# Patient Record
Sex: Male | Born: 1955 | Race: White | Hispanic: No | Marital: Single
Health system: Southern US, Community
[De-identification: ages and names within clinical notes are randomized; demographics above are authoritative.]

---

## 2020-06-07 ENCOUNTER — Encounter (HOSPITAL_COMMUNITY): Payer: Self-pay | Admitting: Emergency Medicine

## 2020-06-07 ENCOUNTER — Emergency Department (HOSPITAL_COMMUNITY): Payer: Self-pay

## 2020-06-07 ENCOUNTER — Emergency Department (HOSPITAL_COMMUNITY)
Admission: EM | Admit: 2020-06-07 | Discharge: 2020-06-07 | Disposition: A | Discharge status: Home / Self Care | Payer: Self-pay | Attending: Emergency Medicine | Admitting: Emergency Medicine

## 2020-06-07 DIAGNOSIS — Y906 Blood alcohol level of 120-199 mg/100 ml: Secondary | ICD-10-CM | POA: Insufficient documentation

## 2020-06-07 DIAGNOSIS — S0101XA Laceration without foreign body of scalp, initial encounter: Secondary | ICD-10-CM | POA: Insufficient documentation

## 2020-06-07 DIAGNOSIS — W01198A Fall on same level from slipping, tripping and stumbling with subsequent striking against other object, initial encounter: Secondary | ICD-10-CM | POA: Insufficient documentation

## 2020-06-07 DIAGNOSIS — R000 Tachycardia, unspecified: Secondary | ICD-10-CM | POA: Insufficient documentation

## 2020-06-07 DIAGNOSIS — S0990XA Unspecified injury of head, initial encounter: Secondary | ICD-10-CM

## 2020-06-07 DIAGNOSIS — W19XXXA Unspecified fall, initial encounter: Secondary | ICD-10-CM

## 2020-06-07 DIAGNOSIS — F10129 Alcohol abuse with intoxication, unspecified: Secondary | ICD-10-CM | POA: Insufficient documentation

## 2020-06-07 DIAGNOSIS — F10920 Alcohol use, unspecified with intoxication, uncomplicated: Secondary | ICD-10-CM

## 2020-06-07 DIAGNOSIS — Y92009 Unspecified place in unspecified non-institutional (private) residence as the place of occurrence of the external cause: Secondary | ICD-10-CM | POA: Insufficient documentation

## 2020-06-07 DIAGNOSIS — S51012A Laceration without foreign body of left elbow, initial encounter: Secondary | ICD-10-CM | POA: Insufficient documentation

## 2020-06-07 LAB — COMPREHENSIVE METABOLIC PANEL
ALT: 24 U/L (ref 0–44)
AST: 50 U/L — ABNORMAL HIGH (ref 15–41)
Albumin: 4 g/dL (ref 3.5–5.0)
Alkaline Phosphatase: 109 U/L (ref 38–126)
Anion gap: 22 — ABNORMAL HIGH (ref 5–15)
BUN: 5 mg/dL — ABNORMAL LOW (ref 8–23)
CO2: 16 mmol/L — ABNORMAL LOW (ref 22–32)
Calcium: 9 mg/dL (ref 8.9–10.3)
Chloride: 93 mmol/L — ABNORMAL LOW (ref 98–111)
Creatinine, Ser: 1.01 mg/dL (ref 0.61–1.24)
GFR, Estimated: >60 mL/min (ref >60)
Glucose, Bld: 101 mg/dL — ABNORMAL HIGH (ref 70–99)
Potassium: 4 mmol/L (ref 3.5–5.1)
Sodium: 131 mmol/L — ABNORMAL LOW (ref 135–145)
Total Bilirubin: 0.8 mg/dL (ref 0.3–1.2)
Total Protein: 8 g/dL (ref 6.5–8.1)

## 2020-06-07 LAB — CBC WITH DIFFERENTIAL/PLATELET
Abs Immature Granulocytes: 0.05 10*3/uL (ref 0.00–0.07)
Basophils Absolute: 0.1 10*3/uL (ref 0.0–0.1)
Basophils Relative: 1 %
Eosinophils Absolute: 0 10*3/uL (ref 0.0–0.5)
Eosinophils Relative: 0 %
HCT: 43.5 % (ref 39.0–52.0)
Hemoglobin: 14.7 g/dL (ref 13.0–17.0)
Immature Granulocytes: 1 %
Lymphocytes Relative: 9 %
Lymphs Abs: 0.8 10*3/uL (ref 0.7–4.0)
MCH: 29.8 pg (ref 26.0–34.0)
MCHC: 33.8 g/dL (ref 30.0–36.0)
MCV: 88.2 fL (ref 80.0–100.0)
Monocytes Absolute: 0.6 10*3/uL (ref 0.1–1.0)
Monocytes Relative: 7 %
Neutro Abs: 6.9 10*3/uL (ref 1.7–7.7)
Neutrophils Relative %: 82 %
Platelets: 221 10*3/uL (ref 150–400)
RBC: 4.93 MIL/uL (ref 4.22–5.81)
RDW: 13.2 % (ref 11.5–15.5)
WBC: 8.4 10*3/uL (ref 4.0–10.5)
nRBC: 0 % (ref 0.0–0.2)

## 2020-06-07 LAB — ETHANOL: Alcohol, Ethyl (B): 143 mg/dL — ABNORMAL HIGH (ref <10)

## 2020-06-07 MED ORDER — SODIUM CHLORIDE 0.9 % IV BOLUS
500.0000 mL | Freq: Once | INTRAVENOUS | Status: AC
  Administered 2020-06-07: 500 mL via INTRAVENOUS

## 2020-06-07 MED ORDER — LIDOCAINE HCL (PF) 1 % IJ SOLN
INTRAMUSCULAR | Status: AC
  Filled 2020-06-07: qty 5

## 2020-06-07 MED ORDER — LIDOCAINE-EPINEPHRINE (PF) 2 %-1:200000 IJ SOLN
20.0000 mL | Freq: Once | INTRAMUSCULAR | Status: DC
  Filled 2020-06-07: qty 20

## 2020-06-07 MED ORDER — LIDOCAINE-EPINEPHRINE 1 %-1:100000 IJ SOLN
20.0000 mL | Freq: Once | INTRAMUSCULAR | Status: AC
  Administered 2020-06-07: 20 mL
  Filled 2020-06-07: qty 1

## 2020-06-07 MED ORDER — SODIUM CHLORIDE 0.9 % IV BOLUS
1000.0000 mL | Freq: Once | INTRAVENOUS | Status: AC
  Administered 2020-06-07: 1000 mL via INTRAVENOUS

## 2020-06-07 NOTE — ED Triage Notes (Signed)
Pt coming from home via EMS with complaints of a fall. Per EMS pt has small laceration on back on head, bleeding controlled. Reports of pt drinking case of beer and taking xanax.

## 2020-06-07 NOTE — Discharge Instructions (Addendum)
You were seen in the emergency department for evaluation of injuries from a fall.  You had a scalp laceration that was repaired with sutures.  These will need to be removed in 7 to 10 days.  Tylenol for pain.  Soap and water to your wounds.

## 2020-06-07 NOTE — ED Notes (Signed)
Spoke to pt step daughter and provided update. She will be coming to give him a ride home. Pt verbalized understanding of plan. Pt has been taken to restroom via wheelchair several times. Pt able to use restroom by self. Can stand up using rails.  Family stated he has been drinking since yesterday and recently go out of rehab.

## 2020-06-07 NOTE — ED Provider Notes (Signed)
[facility] EMERGENCY DEPARTMENT Provider Note   CSN: [id number] Arrival date & time: 06/07/20  1144     History Chief Complaint  Patient presents with  . Fall    Clarence Glass is a 65 y.o. male.  Level 5 caveat secondary altered mental status.  EMS is providing history.  Reportedly just got out of rehab.  Drinking heavily both last evening and this morning.  Fall at home.  Struck his head.  Posterior hematoma and laceration.  Placed in cervical collar for altered mental status.  Patient denies any complaints.  The history is provided by the patient and the EMS personnel.  Fall This is a new problem. The current episode started 1 to 2 hours ago. The problem has not changed since onset.Pertinent negatives include no chest pain, no abdominal pain, no headaches and no shortness of breath. Nothing aggravates the symptoms. Nothing relieves the symptoms. He has tried nothing for the symptoms. The treatment provided no relief.       History reviewed. No pertinent past medical history.  There are no problems to display for this patient.   History reviewed. No pertinent surgical history.     No family history on file.     Home Medications Prior to Admission medications   Not on File    Allergies    Patient has no allergy information on record.  Review of Systems   Review of Systems  Unable to perform ROS: Mental status change  Respiratory: Negative for shortness of breath.   Cardiovascular: Negative for chest pain.  Gastrointestinal: Negative for abdominal pain.  Neurological: Negative for headaches.    Physical Exam Updated Vital Signs BP 112/76 (BP Location: Left Arm)   Pulse (!) 126   Temp 97.6 F (36.4 C) (Oral)   Resp 18   SpO2 95%   Physical Exam Vitals and nursing note reviewed.  Constitutional:      Appearance: He is well-developed and well-nourished.  HENT:     Head: Normocephalic.     Comments: Posterior hematoma with  laceration Eyes:     Conjunctiva/sclera: Conjunctivae normal.  Neck:     Comments: In cervical collar trach midline. Cardiovascular:     Rate and Rhythm: Regular rhythm. Tachycardia present.     Heart sounds: No murmur heard.   Pulmonary:     Effort: Pulmonary effort is normal. No respiratory distress.     Breath sounds: Normal breath sounds.  Abdominal:     Palpations: Abdomen is soft.     Tenderness: There is no abdominal tenderness.  Musculoskeletal:        General: Tenderness and signs of injury present. No deformity or edema.     Right lower leg: No edema.     Left lower leg: No edema.     Comments: He has some bruising on his elbows and a skin tear on the left elbow.  Full range of motion.  Skin:    General: Skin is warm and dry.  Neurological:     General: No focal deficit present.     Mental Status: He is alert.     Comments: Patient is awake and alert.  Slurred speech consistent with intoxication.  Moving all extremities without any focal deficits.  Psychiatric:        Mood and Affect: Mood and affect normal.     ED Results / Procedures / Treatments   Labs (all labs ordered are listed, but only abnormal results are displayed) Labs Reviewed  COMPREHENSIVE METABOLIC PANEL - Abnormal; Notable for the following components:      Result Value   Sodium 131 (*)    Chloride 93 (*)    CO2 16 (*)    Glucose, Bld 101 (*)    BUN 5 (*)    AST 50 (*)    Anion gap 22 (*)    All other components within normal limits  ETHANOL - Abnormal; Notable for the following components:   Alcohol, Ethyl (B) 143 (*)    All other components within normal limits  CBC WITH DIFFERENTIAL/PLATELET    EKG None  Radiology CT Head Wo Contrast  Result Date: 06/07/2020 CLINICAL DATA:  Head trauma, abnormal mental status. Neck trauma, intoxicated or up tended. Additional history provided: Fall, small laceration on back of head, bleeding controlled. EXAM: CT HEAD WITHOUT CONTRAST CT CERVICAL  SPINE WITHOUT CONTRAST TECHNIQUE: Multidetector CT imaging of the head and cervical spine was performed following the standard protocol without intravenous contrast. Multiplanar CT image reconstructions of the cervical spine were also generated. COMPARISON:  No pertinent prior exams available for comparison. FINDINGS: CT HEAD FINDINGS Brain: Mild cerebral atrophy. Mild ill-defined hypoattenuation within the cerebral white matter is nonspecific, but compatible with chronic small vessel ischemic disease. There is no acute intracranial hemorrhage. No demarcated cortical infarct. No extra-axial fluid collection. No evidence of intracranial mass. No midline shift. Vascular: No hyperdense vessel. Skull: Normal. Negative for fracture or focal lesion. Sinuses/Orbits: Visualized orbits show no acute finding. Mild bilateral ethmoid and maxillary sinus mucosal thickening at the imaged levels. Other: Right occipital scalp laceration CT CERVICAL SPINE FINDINGS Alignment: Straightening of the expected cervical lordosis. No significant spondylolisthesis. Skull base and vertebrae: The basion-dental and atlanto-dental intervals are maintained.No evidence of acute fracture to the cervical spine. Soft tissues and spinal canal: No prevertebral fluid or swelling. No visible canal hematoma. Disc levels: Cervical spondylosis with multilevel disc space narrowing, disc bulges, posterior disc osteophytes and uncovertebral hypertrophy. Multilevel ventral osteophytes. Of note, a posterior disc osteophyte complex contributes to suspected at least moderate spinal canal stenosis at C4-C5. Multilevel bony neural foraminal narrowing. Upper chest: No consolidation within the imaged lung apices. No visible pneumothorax. Paraseptal emphysema. IMPRESSION: CT head: 1. No evidence of acute intracranial abnormality. 2. Right occipital scalp laceration. 3. Mild cerebral atrophy and chronic small vessel ischemic disease. 4. Mild paranasal sinus mucosal  thickening. CT cervical spine: 1. No evidence of acute fracture to the cervical spine. 2. Cervical spondylosis as described. Notably, a posterior disc osteophyte complex contributes to suspected at least moderate spinal canal stenosis at C4-C5. 3. Emphysema (ICD10-J43.9). Electronically Signed   By: Jackey Loge DO   On: 06/07/2020 12:59   CT Cervical Spine Wo Contrast  Result Date: 06/07/2020 CLINICAL DATA:  Head trauma, abnormal mental status. Neck trauma, intoxicated or up tended. Additional history provided: Fall, small laceration on back of head, bleeding controlled. EXAM: CT HEAD WITHOUT CONTRAST CT CERVICAL SPINE WITHOUT CONTRAST TECHNIQUE: Multidetector CT imaging of the head and cervical spine was performed following the standard protocol without intravenous contrast. Multiplanar CT image reconstructions of the cervical spine were also generated. COMPARISON:  No pertinent prior exams available for comparison. FINDINGS: CT HEAD FINDINGS Brain: Mild cerebral atrophy. Mild ill-defined hypoattenuation within the cerebral white matter is nonspecific, but compatible with chronic small vessel ischemic disease. There is no acute intracranial hemorrhage. No demarcated cortical infarct. No extra-axial fluid collection. No evidence of intracranial mass. No midline shift. Vascular: No hyperdense vessel. Skull:  Normal. Negative for fracture or focal lesion. Sinuses/Orbits: Visualized orbits show no acute finding. Mild bilateral ethmoid and maxillary sinus mucosal thickening at the imaged levels. Other: Right occipital scalp laceration CT CERVICAL SPINE FINDINGS Alignment: Straightening of the expected cervical lordosis. No significant spondylolisthesis. Skull base and vertebrae: The basion-dental and atlanto-dental intervals are maintained.No evidence of acute fracture to the cervical spine. Soft tissues and spinal canal: No prevertebral fluid or swelling. No visible canal hematoma. Disc levels: Cervical spondylosis  with multilevel disc space narrowing, disc bulges, posterior disc osteophytes and uncovertebral hypertrophy. Multilevel ventral osteophytes. Of note, a posterior disc osteophyte complex contributes to suspected at least moderate spinal canal stenosis at C4-C5. Multilevel bony neural foraminal narrowing. Upper chest: No consolidation within the imaged lung apices. No visible pneumothorax. Paraseptal emphysema. IMPRESSION: CT head: 1. No evidence of acute intracranial abnormality. 2. Right occipital scalp laceration. 3. Mild cerebral atrophy and chronic small vessel ischemic disease. 4. Mild paranasal sinus mucosal thickening. CT cervical spine: 1. No evidence of acute fracture to the cervical spine. 2. Cervical spondylosis as described. Notably, a posterior disc osteophyte complex contributes to suspected at least moderate spinal canal stenosis at C4-C5. 3. Emphysema (ICD10-J43.9). Electronically Signed   By: Jackey LogeKyle  Golden DO   On: 06/07/2020 12:59    Procedures .Marland Kitchen.Laceration Repair  Date/Time: 06/07/2020 2:10 PM Performed by: Terrilee FilesButler, Rumi Taras C, MD Authorized by: Terrilee FilesButler, Dianey Suchy C, MD   Consent:    Consent obtained:  Verbal   Consent given by:  Patient   Risks discussed:  Infection, pain, poor cosmetic result, poor wound healing and retained foreign body   Alternatives discussed:  No treatment and delayed treatment Universal protocol:    Procedure explained and questions answered to patient or proxy's satisfaction: yes     Patient identity confirmed:  Verbally with patient Anesthesia:    Anesthesia method:  Local infiltration   Local anesthetic:  Lidocaine 2% WITH epi Laceration details:    Location:  Scalp   Scalp location:  Occipital   Length (cm):  4 Treatment:    Area cleansed with:  Saline   Amount of cleaning:  Standard Skin repair:    Repair method:  Staples   Number of staples:  5 Approximation:    Approximation:  Close Repair type:    Repair type:  Simple Post-procedure  details:    Dressing:  Open (no dressing)   Procedure completion:  Tolerated well, no immediate complications     Medications Ordered in ED Medications  sodium chloride 0.9 % bolus 1,000 mL (0 mLs Intravenous Stopped 06/07/20 1420)  lidocaine-EPINEPHrine (XYLOCAINE W/EPI) 1 %-1:100000 (with pres) injection 20 mL (20 mLs Infiltration Given 06/07/20 1419)  sodium chloride 0.9 % bolus 500 mL (0 mLs Intravenous Stopped 06/07/20 1621)    ED Course  I have reviewed the triage vital signs and the nursing notes.  Pertinent labs & imaging results that were available during my care of the patient were reviewed by me and considered in my medical decision making (see chart for details).  Clinical Course as of 06/07/20 1718  Mon Jun 07, 2020  1430 Patient has labs showing low sodium low bicarb.  Likely due to some beer potomania and alcoholic ketoacidosis.  He is tolerating fluids here. [MB]    Clinical Course User Index [MB] Terrilee FilesButler, Kendalynn Wideman C, MD   MDM Rules/Calculators/A&P  This patient complains of fall intoxication head lack; this involves an extensive number of treatment Options and is a complaint that carries with it a high risk of complications and Morbidity. The differential includes alcohol intoxication, skull fracture, head bleed, contusion, laceration, metabolic derangement  I ordered, reviewed and interpreted labs, which included CBC white count normal hemoglobin chemistries with mildly low sodium low bicarb reflecting some dehydration, question alcoholic ketoacidosis I ordered medication IV fluids oral fluids I ordered imaging studies which included CT head and cervical spine and I independently    visualized and interpreted imaging which showed atrophy and degenerative changes no acute bleed or fracture Additional history obtained from EMS Previous records obtained and reviewed in epic, no recent visits  After the interventions stated above, I reevaluated  the patient and found patient still to be unsteady on feet.  He is given some IV fluids here.  Wound has been repaired.  Nursing is going to try to reach out to family to see if he can find him a safe ride home.   Final Clinical Impression(s) / ED Diagnoses Final diagnoses:  Alcoholic intoxication without complication (HCC)  Fall, initial encounter  Minor head injury, initial encounter  Laceration of scalp, initial encounter    Rx / DC Orders ED Discharge Orders    None       Terrilee Files, MD 06/07/20 1721

## 2020-12-13 DEATH — deceased

## 2021-12-13 DEATH — deceased

## 2022-02-08 IMAGING — CT CT CERVICAL SPINE W/O CM
3 of 4 series · 13 of 33 positions shown, 16 images · non-contrast
Comparison: No pertinent prior exams available for comparison.

CLINICAL DATA: Head trauma, abnormal mental status. Neck trauma,
intoxicated or up tended. Additional history provided: Fall, small
laceration on back of head, bleeding controlled.

EXAM:
CT HEAD WITHOUT CONTRAST
CT CERVICAL SPINE WITHOUT CONTRAST
TECHNIQUE: Multidetector CT imaging of the head and cervical spine was
performed following the standard protocol without intravenous
contrast. Multiplanar CT image reconstructions of the cervical spine
were also generated.

[Series 8: coronal bone · coronal · 0.30mm/px · 3 of 87 slices shown]
[im 18/87  bone]
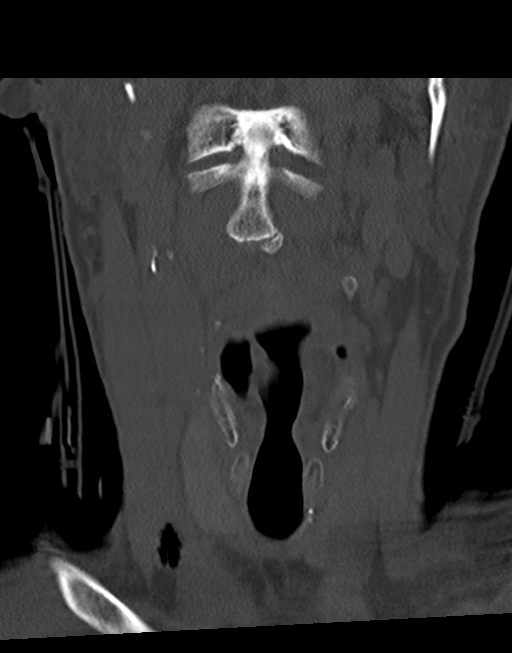
[im 35/87  bone]
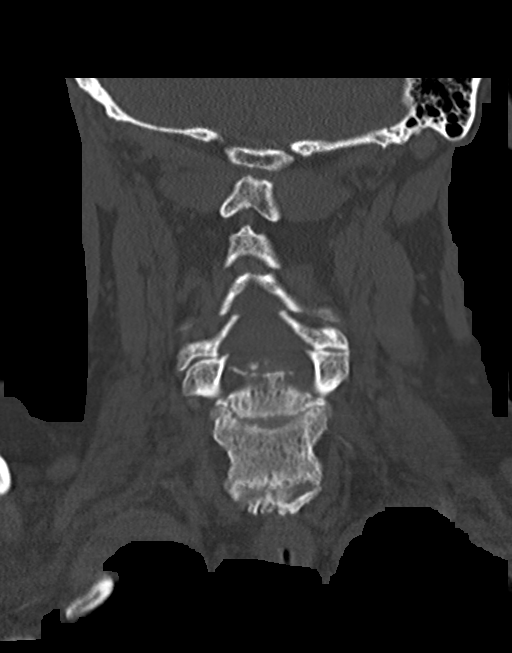
[im 52/87  bone]
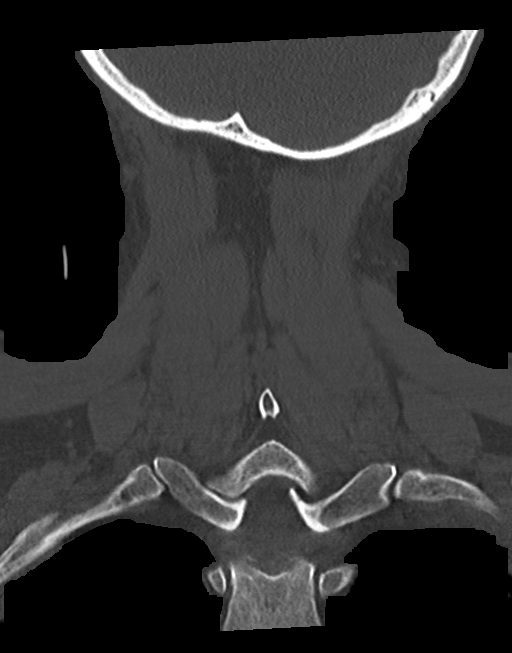

[Series 9: sagittal bone · sagittal · 0.37mm/px · 5 of 68 slices shown, 6 images]
[im 23/68  bone]
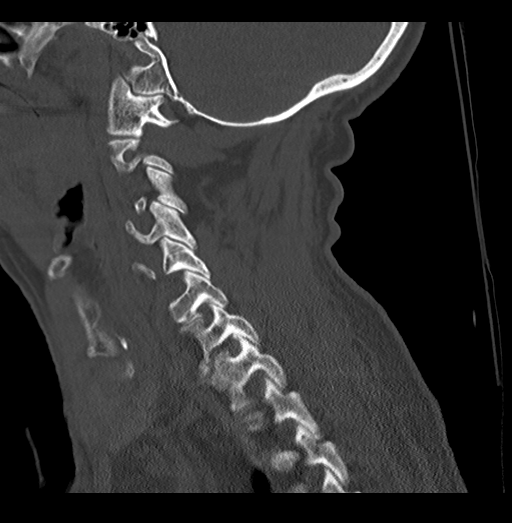
[im 28/68  bone]
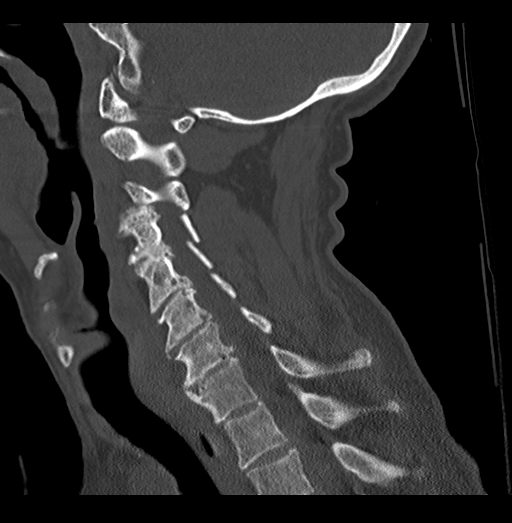
[im 34/68  soft-tissue]
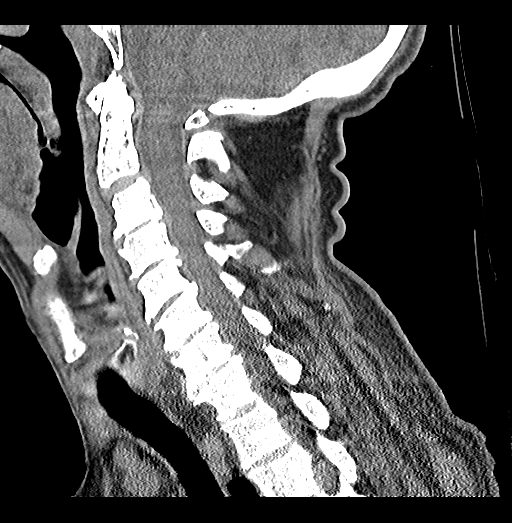
[im 34/68  bone]
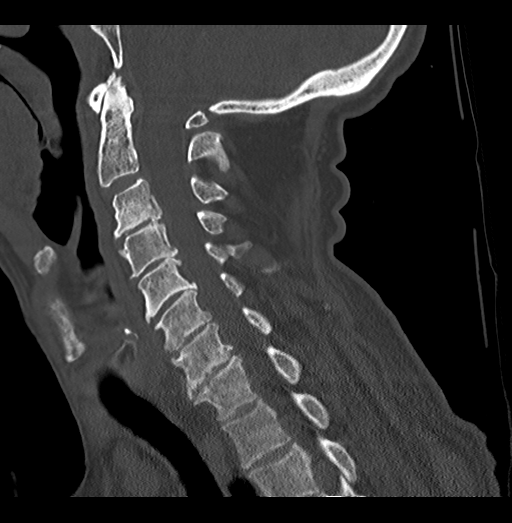
[im 40/68  bone]
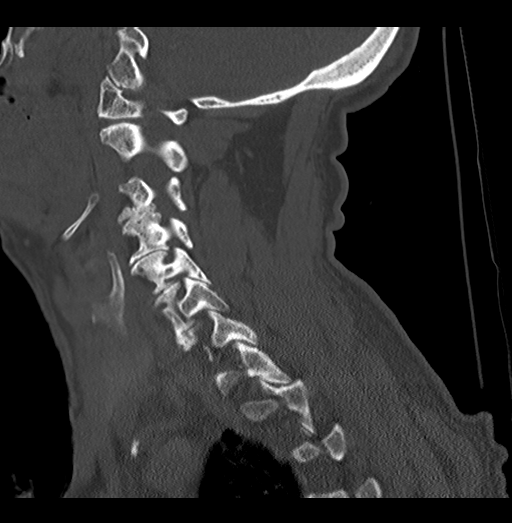
[im 45/68  bone]
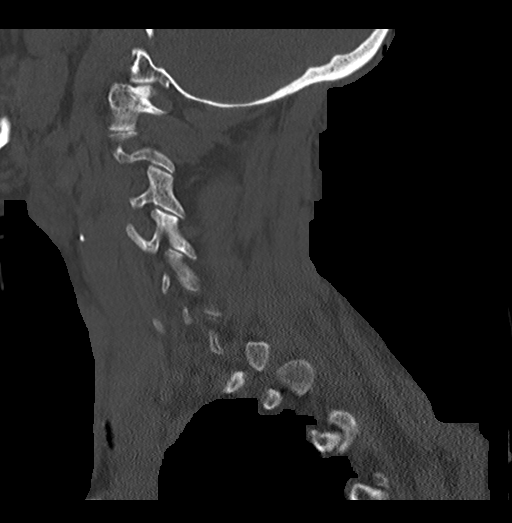

[Series 11: orthogonal axial st · axial · 0.27mm/px · z∈[-269,-145]mm · 5 of 95 slices shown, 7 images]
[im 16/95  soft-tissue]
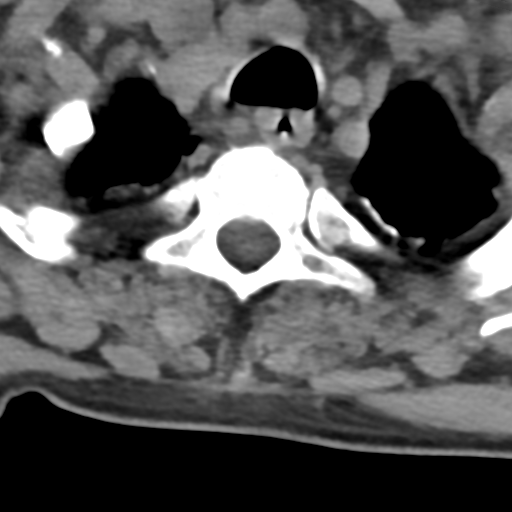
[im 16/95  bone]
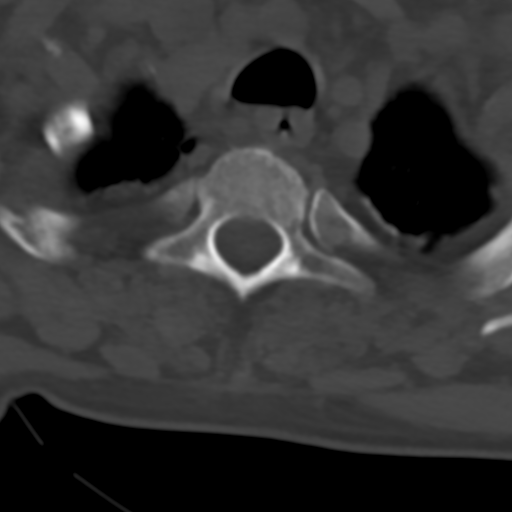
[im 32/95  bone]
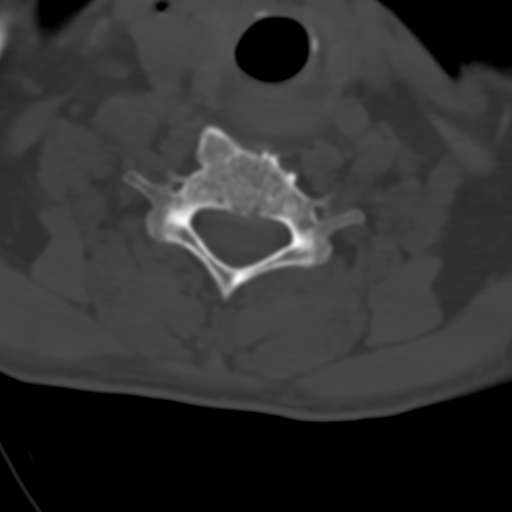
[im 48/95  bone]
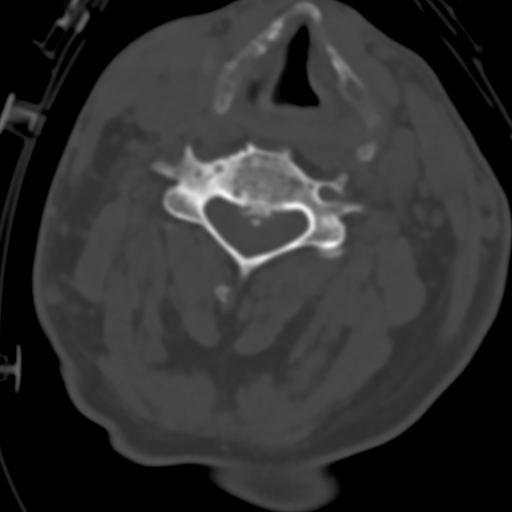
[im 63/95  bone]
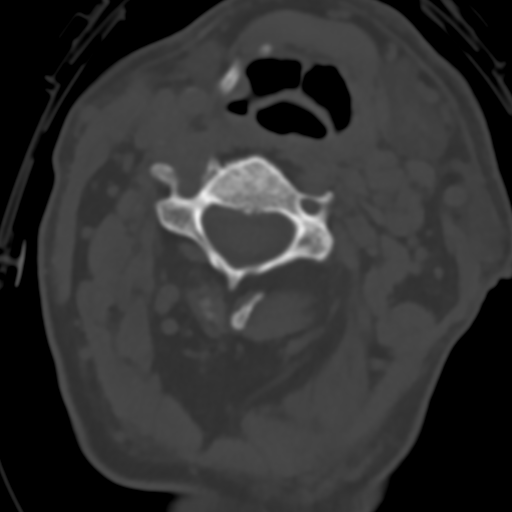
[im 79/95  soft-tissue]
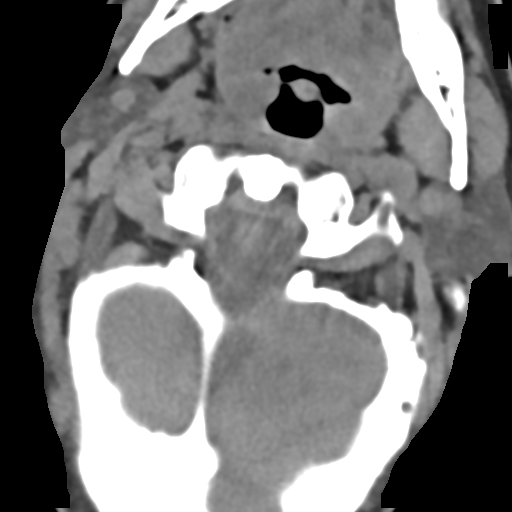
[im 79/95  bone]
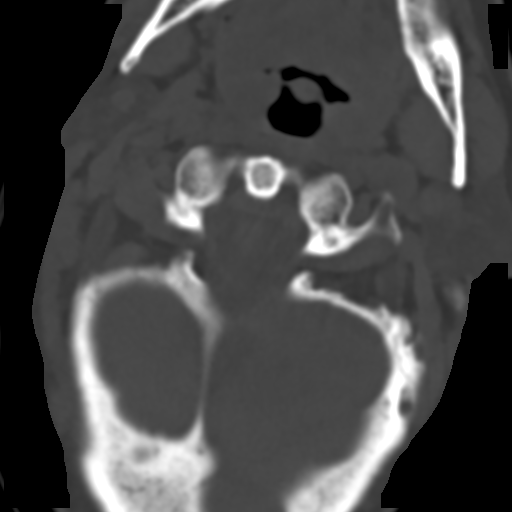

[13 of 33 positions shown; findings below may reference images not displayed]

FINDINGS: CT HEAD FINDINGS

Brain:

Mild cerebral atrophy.

Mild ill-defined hypoattenuation within the cerebral white matter is
nonspecific, but compatible with chronic small vessel ischemic
disease.

There is no acute intracranial hemorrhage.

No demarcated cortical infarct.

No extra-axial fluid collection.

No evidence of intracranial mass.

No midline shift.

Vascular: No hyperdense vessel.

Skull: Normal. Negative for fracture or focal lesion.

Sinuses/Orbits: Visualized orbits show no acute finding. Mild
bilateral ethmoid and maxillary sinus mucosal thickening at the
imaged levels.

Other: Right occipital scalp laceration

CT CERVICAL SPINE FINDINGS

Alignment: Straightening of the expected cervical lordosis. No
significant spondylolisthesis.

Skull base and vertebrae: The basion-dental and atlanto-dental
intervals are maintained.No evidence of acute fracture to the
cervical spine.

Soft tissues and spinal canal: No prevertebral fluid or swelling. No
visible canal hematoma.

Disc levels: Cervical spondylosis with multilevel disc space
narrowing, disc bulges, posterior disc osteophytes and uncovertebral
hypertrophy. Multilevel ventral osteophytes. Of note, a posterior
disc osteophyte complex contributes to suspected at least moderate
spinal canal stenosis at C4-C5. Multilevel bony neural foraminal
narrowing.

Upper chest: No consolidation within the imaged lung apices. No
visible pneumothorax. Paraseptal emphysema.
IMPRESSION: CT head:

1. No evidence of acute intracranial abnormality.
2. Right occipital scalp laceration.
3. Mild cerebral atrophy and chronic small vessel ischemic disease.
4. Mild paranasal sinus mucosal thickening.

CT cervical spine:

1. No evidence of acute fracture to the cervical spine.
2. Cervical spondylosis as described. Notably, a posterior disc
osteophyte complex contributes to suspected at least moderate spinal
canal stenosis at C4-C5.
3. Emphysema ([id number]).
